# Patient Record
Sex: Female | Born: 1975 | Race: White | Hispanic: No | Marital: Married | Smoking: Never smoker
Health system: Southern US, Community
[De-identification: ages and names within clinical notes are randomized; demographics above are authoritative.]

## PROBLEM LIST (undated history)

## (undated) HISTORY — PX: TUBAL LIGATION: SHX77

## (undated) HISTORY — PX: RECTAL POLYPECTOMY: SHX2309

---

## 2013-04-30 ENCOUNTER — Encounter (HOSPITAL_BASED_OUTPATIENT_CLINIC_OR_DEPARTMENT_OTHER): Payer: Self-pay | Admitting: *Deleted

## 2013-04-30 ENCOUNTER — Emergency Department (HOSPITAL_BASED_OUTPATIENT_CLINIC_OR_DEPARTMENT_OTHER)
Admission: EM | Admit: 2013-04-30 | Discharge: 2013-04-30 | Disposition: A | Discharge status: Home / Self Care | Payer: Self-pay | Attending: Emergency Medicine | Admitting: Emergency Medicine

## 2013-04-30 ENCOUNTER — Emergency Department (HOSPITAL_BASED_OUTPATIENT_CLINIC_OR_DEPARTMENT_OTHER): Payer: Self-pay

## 2013-04-30 DIAGNOSIS — W108XXA Fall (on) (from) other stairs and steps, initial encounter: Secondary | ICD-10-CM | POA: Insufficient documentation

## 2013-04-30 DIAGNOSIS — S93409A Sprain of unspecified ligament of unspecified ankle, initial encounter: Secondary | ICD-10-CM | POA: Insufficient documentation

## 2013-04-30 DIAGNOSIS — Y929 Unspecified place or not applicable: Secondary | ICD-10-CM | POA: Insufficient documentation

## 2013-04-30 DIAGNOSIS — Y939 Activity, unspecified: Secondary | ICD-10-CM | POA: Insufficient documentation

## 2013-04-30 MED ORDER — HYDROCODONE-ACETAMINOPHEN 5-325 MG PO TABS
1.0000 | ORAL_TABLET | Freq: Once | ORAL | Status: AC
  Administered 2013-04-30: 1 via ORAL
  Filled 2013-04-30: qty 1

## 2013-04-30 MED ORDER — HYDROCODONE-ACETAMINOPHEN 5-325 MG PO TABS: ORAL_TABLET | ORAL | Status: AC

## 2013-04-30 MED ORDER — IBUPROFEN 600 MG PO TABS: 600.0000 mg | ORAL_TABLET | Freq: Three times a day (TID) | ORAL | Status: AC

## 2013-04-30 MED ORDER — IBUPROFEN 400 MG PO TABS
400.0000 mg | ORAL_TABLET | Freq: Once | ORAL | Status: AC
  Administered 2013-04-30: 400 mg via ORAL
  Filled 2013-04-30: qty 1

## 2013-04-30 NOTE — Discharge Instructions (Signed)
 Ankle Sprain An ankle sprain is an injury to the strong, fibrous tissues (ligaments) that hold the bones of your ankle joint together.  CAUSES An ankle sprain is usually caused by a fall or by twisting your ankle. Ankle sprains most commonly occur when you step on the outer edge of your foot, and your ankle turns inward. People who participate in sports are more prone to these types of injuries.  SYMPTOMS   Pain in your ankle. The pain may be present at rest or only when you are trying to stand or walk.  Swelling.  Bruising. Bruising may develop immediately or within 1 to 2 days after your injury.  Difficulty standing or walking, particularly when turning corners or changing directions. DIAGNOSIS  Your caregiver will ask you details about your injury and perform a physical exam of your ankle to determine if you have an ankle sprain. During the physical exam, your caregiver will press on and apply pressure to specific areas of your foot and ankle. Your caregiver will try to move your ankle in certain ways. An X-ray exam may be done to be sure a bone was not broken or a ligament did not separate from one of the bones in your ankle (avulsion fracture).  TREATMENT  Certain types of braces can help stabilize your ankle. Your caregiver can make a recommendation for this. Your caregiver may recommend the use of medicine for pain. If your sprain is severe, your caregiver may refer you to a surgeon who helps to restore function to parts of your skeletal system (orthopedist) or a physical therapist. HOME CARE INSTRUCTIONS   Apply ice to your injury for 1 2 days or as directed by your caregiver. Applying ice helps to reduce inflammation and pain.  Put ice in a plastic bag.  Place a towel between your skin and the bag.  Leave the ice on for 15-20 minutes at a time, every 2 hours while you are awake.  Only take over-the-counter or prescription medicines for pain, discomfort, or fever as directed by  your caregiver.  Elevate your injured ankle above the level of your heart as much as possible for 2 3 days.  If your caregiver recommends crutches, use them as instructed. Gradually put weight on the affected ankle. Continue to use crutches or a cane until you can walk without feeling pain in your ankle.  If you have a plaster splint, wear the splint as directed by your caregiver. Do not rest it on anything harder than a pillow for the first 24 hours. Do not put weight on it. Do not get it wet. You may take it off to take a shower or bath.  You may have been given an elastic bandage to wear around your ankle to provide support. If the elastic bandage is too tight (you have numbness or tingling in your foot or your foot becomes cold and blue), adjust the bandage to make it comfortable.  If you have an air splint, you may blow more air into it or let air out to make it more comfortable. You may take your splint off at night and before taking a shower or bath. Wiggle your toes in the splint several times per day to decrease swelling. SEEK MEDICAL CARE IF:   You have rapidly increasing bruising or swelling.  Your toes feel extremely cold or you lose feeling in your foot.  Your pain is not relieved with medicine. SEEK IMMEDIATE MEDICAL CARE IF:  Your toes are numb  or blue.  You have severe pain that is increasing. MAKE SURE YOU:   Understand these instructions.  Will watch your condition.  Will get help right away if you are not doing well or get worse. Document Released: 10/26/2005 Document Revised: 07/20/2012 Document Reviewed: 11/07/2011 [facility] Patient Information 2014 [place], [place].    Narcotic and benzodiazepine use may cause drowsiness, slowed breathing or dependence.  Please use with caution and do not drive, operate machinery or watch young children alone while taking them.  Taking combinations of these medications or drinking alcohol will potentiate these effects.

## 2013-04-30 NOTE — ED Notes (Signed)
I placed an ASO on the patient's left ankle. There isn't any place to charge for an ASO, in the charge capture. This is the only way I know to call attention to not having anyway to charge for it.

## 2013-04-30 NOTE — ED Provider Notes (Signed)
History     CSN: [id number]  Arrival date & time 04/30/13  1412   First MD Initiated Contact with Patient 04/30/13 1451      Chief Complaint  Patient presents with  . Ankle Injury    (Consider location/radiation/quality/duration/timing/severity/associated sxs/prior treatment) HPI Comments: No knee pain, hip pain.  Mechanical fall when she missed last few stairs at bottom of stiarcase in the dark.  No head injury, neck pain.  No laceration or abrasion, no weakness or numbness  Patient is a 37 y.o. female presenting with lower extremity injury. The history is provided by the patient and a relative.  Ankle Injury This is a new problem. The current episode started 12 to 24 hours ago. The problem occurs constantly. The problem has been gradually worsening. The symptoms are aggravated by walking, bending and twisting. The symptoms are relieved by rest. She has tried nothing for the symptoms. The treatment provided no relief.    History reviewed. No pertinent past medical history.  History reviewed. No pertinent past surgical history.  History reviewed. No pertinent family history.  History  Substance Use Topics  . Smoking status: Never Smoker   . Smokeless tobacco: Not on file  . Alcohol Use: No    OB History   Grav Para Term Preterm Abortions TAB SAB Ect Mult Living                  Review of Systems  Cardiovascular: Negative for leg swelling.  Musculoskeletal: Positive for joint swelling and arthralgias.  Skin: Negative for color change, rash and wound.  Neurological: Negative for weakness and numbness.  Hematological: Does not bruise/bleed easily.    Allergies  Prednisone  Home Medications   Current Outpatient Rx  Name  Route  Sig  Dispense  Refill  . HYDROcodone-acetaminophen (NORCO/VICODIN) 5-325 MG per tablet      1-2 tablets po q 6 hours prn moderate to severe pain   20 tablet   0   . ibuprofen (ADVIL,MOTRIN) 600 MG tablet   Oral   Take 1 tablet (600  mg total) by mouth every 8 (eight) hours. Take with food   21 tablet   0     BP   Pulse 80  Temp(Src) 98.6 F (37 C) (Oral)  Resp 16  Ht 5\' 4"  (1.626 m)  Wt 155 lb (70.308 kg)  BMI 26.59 kg/m2  SpO2 99%  LMP 04/01/2013  Physical Exam  Nursing note and vitals reviewed. Constitutional: She appears well-developed and well-nourished. No distress.  Musculoskeletal:       Left ankle: She exhibits decreased range of motion, swelling and ecchymosis. She exhibits no deformity, no laceration and normal pulse. Tenderness. No lateral malleolus, no medial malleolus and no proximal fibula tenderness found. Achilles tendon normal.       Feet:  Skin: Skin is warm and dry. No rash noted.    ED Course  Procedures (including critical care time)  Labs Reviewed - No data to display Dg Ankle Complete Left  04/30/2013   *RADIOLOGY REPORT*  Clinical Data:  Injury to left ankle with pain.  LEFT ANKLE COMPLETE - 3+ VIEW  Comparison:  None.  Findings:  There is no evidence of fracture, dislocation, or joint effusion.  There is no evidence of arthropathy or other focal bone abnormality.  Soft tissues are unremarkable.  IMPRESSION: Negative.   Original Report Authenticated By: Irish Lack, M.D.   Dg Foot Complete Left  04/30/2013   *RADIOLOGY REPORT*  Clinical  Data: Left foot injury with pain.  LEFT FOOT - COMPLETE 3+ VIEW  Comparison:  None.  Findings: Irregularity at the base of the fifth proximal phalanx likely is related to an old fracture.  No acute fracture or dislocation is identified.  Soft tissues are unremarkable.  No evidence of bony lesion or arthropathy.  IMPRESSION: Probable old fracture involving the base of the fifth proximal phalanx.  No acute fracture is identified. .   Original Report Authenticated By: Irish Lack, M.D.     1. Ankle sprain and strain, left, initial encounter       MDM  I reviewed plain films myself.  No neurovascular compromise.  Pt with no acute fracture on  plain films.  No tenderness above malleolus to suggest fracture.  Pt's pain and tenderness are near ligamentous structures connecting fibula to talus.  Likely ankle sprain.  RICE at home, Rx for analgesic.  Pt already with crutches, will provide ASO brace        Gavin Pound. Venkat Ankney, MD 04/30/13 1512

## 2013-04-30 NOTE — ED Notes (Signed)
Pt states she missed the last few steps last p.m. And injured her left foot and ankle. PMS intact

## 2014-01-17 ENCOUNTER — Emergency Department (HOSPITAL_BASED_OUTPATIENT_CLINIC_OR_DEPARTMENT_OTHER)
Admission: EM | Admit: 2014-01-17 | Discharge: 2014-01-17 | Disposition: A | Discharge status: Home / Self Care | Payer: Self-pay | Attending: Emergency Medicine | Admitting: Emergency Medicine

## 2014-01-17 ENCOUNTER — Emergency Department (HOSPITAL_BASED_OUTPATIENT_CLINIC_OR_DEPARTMENT_OTHER): Payer: Self-pay

## 2014-01-17 ENCOUNTER — Encounter (HOSPITAL_BASED_OUTPATIENT_CLINIC_OR_DEPARTMENT_OTHER): Payer: Self-pay | Admitting: Emergency Medicine

## 2014-01-17 DIAGNOSIS — Z9851 Tubal ligation status: Secondary | ICD-10-CM | POA: Insufficient documentation

## 2014-01-17 DIAGNOSIS — R0789 Other chest pain: Secondary | ICD-10-CM | POA: Insufficient documentation

## 2014-01-17 DIAGNOSIS — R079 Chest pain, unspecified: Secondary | ICD-10-CM

## 2014-01-17 DIAGNOSIS — K625 Hemorrhage of anus and rectum: Secondary | ICD-10-CM | POA: Insufficient documentation

## 2014-01-17 DIAGNOSIS — K219 Gastro-esophageal reflux disease without esophagitis: Secondary | ICD-10-CM | POA: Insufficient documentation

## 2014-01-17 DIAGNOSIS — R110 Nausea: Secondary | ICD-10-CM | POA: Insufficient documentation

## 2014-01-17 DIAGNOSIS — R0602 Shortness of breath: Secondary | ICD-10-CM | POA: Insufficient documentation

## 2014-01-17 LAB — CBC WITH DIFFERENTIAL/PLATELET
Basophils Absolute: 0 10*3/uL (ref 0.0–0.1)
Basophils Relative: 0 % (ref 0–1)
EOS ABS: 0.1 10*3/uL (ref 0.0–0.7)
Eosinophils Relative: 1 % (ref 0–5)
HCT: 34.5 % — ABNORMAL LOW (ref 36.0–46.0)
HEMOGLOBIN: 11.4 g/dL — AB (ref 12.0–15.0)
LYMPHS ABS: 1.8 10*3/uL (ref 0.7–4.0)
LYMPHS PCT: 27 % (ref 12–46)
MCH: 30.2 pg (ref 26.0–34.0)
MCHC: 33 g/dL (ref 30.0–36.0)
MCV: 91.5 fL (ref 78.0–100.0)
MONOS PCT: 8 % (ref 3–12)
Monocytes Absolute: 0.5 10*3/uL (ref 0.1–1.0)
NEUTROS PCT: 64 % (ref 43–77)
Neutro Abs: 4.3 10*3/uL (ref 1.7–7.7)
Platelets: 351 10*3/uL (ref 150–400)
RBC: 3.77 MIL/uL — AB (ref 3.87–5.11)
RDW: 12.2 % (ref 11.5–15.5)
WBC: 6.7 10*3/uL (ref 4.0–10.5)

## 2014-01-17 LAB — I-STAT CHEM 8, ED
BUN: 6 mg/dL (ref 6–23)
CHLORIDE: 100 meq/L (ref 96–112)
CREATININE: 0.7 mg/dL (ref 0.50–1.10)
Calcium, Ion: 1.21 mmol/L (ref 1.12–1.23)
Glucose, Bld: 90 mg/dL (ref 70–99)
HCT: 35 % — ABNORMAL LOW (ref 36.0–46.0)
Hemoglobin: 11.9 g/dL — ABNORMAL LOW (ref 12.0–15.0)
Potassium: 3.7 mEq/L (ref 3.7–5.3)
SODIUM: 141 meq/L (ref 137–147)
TCO2: 27 mmol/L (ref 0–100)

## 2014-01-17 LAB — OCCULT BLOOD X 1 CARD TO LAB, STOOL: FECAL OCCULT BLD: POSITIVE — AB

## 2014-01-17 LAB — TROPONIN I: Troponin I: <0.3 ng/mL (ref <0.30)

## 2014-01-17 LAB — D-DIMER, QUANTITATIVE (NOT AT ARMC): D DIMER QUANT: 0.33 ug{FEU}/mL (ref 0.00–0.48)

## 2014-01-17 MED ORDER — PANTOPRAZOLE SODIUM 20 MG PO TBEC: 20.0000 mg | DELAYED_RELEASE_TABLET | Freq: Every day | ORAL | Status: AC

## 2014-01-17 NOTE — Discharge Instructions (Signed)
As discussed you need to follow up with your gi doctor about the bleeding and also discuss reflux. Diet for Gastroesophageal Reflux Disease, Adult Reflux (acid reflux) is when acid from your stomach flows up into the esophagus. When acid comes in contact with the esophagus, the acid causes irritation and soreness (inflammation) in the esophagus. When reflux happens often or so severely that it causes damage to the esophagus, it is called gastroesophageal reflux disease (GERD). Nutrition therapy can help ease the discomfort of GERD. FOODS OR DRINKS TO AVOID OR LIMIT  Smoking or chewing tobacco. Nicotine is one of the most potent stimulants to acid production in the gastrointestinal tract.  Caffeinated and decaffeinated coffee and black tea.  Regular or low-calorie carbonated beverages or energy drinks (caffeine-free carbonated beverages are allowed).   Strong spices, such as black pepper, white pepper, red pepper, cayenne, curry powder, and chili powder.  Peppermint or spearmint.  Chocolate.  High-fat foods, including meats and fried foods. Extra added fats including oils, butter, salad dressings, and nuts. Limit these to less than 8 tsp per day.  Fruits and vegetables if they are not tolerated, such as citrus fruits or tomatoes.  Alcohol.  Any food that seems to aggravate your condition. If you have questions regarding your diet, call your caregiver or a registered dietitian. OTHER THINGS THAT MAY HELP GERD INCLUDE:   Eating your meals slowly, in a relaxed setting.  Eating 5 to 6 small meals per day instead of 3 large meals.  Eliminating food for a period of time if it causes distress.  Not lying down until 3 hours after eating a meal.  Keeping the head of your bed raised 6 to 9 inches (15 to 23 cm) by using a foam wedge or blocks under the legs of the bed. Lying flat may make symptoms worse.  Being physically active. Weight loss may be helpful in reducing reflux in overweight  or obese adults.  Wear loose fitting clothing EXAMPLE MEAL PLAN This meal plan is approximately 2,000 calories based on https://www.[place].org/ChooseMyPlate.gov meal planning guidelines. Breakfast   cup cooked oatmeal.  1 cup strawberries.  1 cup low-fat milk.  1 oz almonds. Snack  1 cup cucumber slices.  6 oz yogurt (made from low-fat or fat-free milk). Lunch  2 slice whole-wheat bread.  2 oz sliced [place].  2 tsp mayonnaise.  1 cup blueberries.  1 cup snap peas. Snack  6 whole-wheat crackers.  1 oz string cheese. Dinner   cup brown rice.  1 cup mixed veggies.  1 tsp olive oil.  3 oz grilled fish. Document Released: 10/26/2005 Document Revised: 01/18/2012 Document Reviewed: 09/11/2011 [facility] Patient Information 2014 [place], [place].

## 2014-01-17 NOTE — ED Provider Notes (Signed)
CSN: [id number]     Arrival date & time 01/17/14  1241 History   First MD Initiated Contact with Patient 01/17/14 1242     Chief Complaint  Patient presents with  . Chest Pain     (Consider location/radiation/quality/duration/timing/severity/associated sxs/prior Treatment) HPI Comments: Pt states that over the last couple of weeks she has had intermittent sharp left sided cp without radiation. Does have some nausea and sob when that pain starts. Pain lasts usually for a couple of minutes and resolves on its own. Nothing makes the pain better or worse. Denies abdominal cp, swelling, fever. She states that she has noticed that when she eats it feels like the food sits at the bottom of her throat, but it is not associated with cp. Pt states that over the last week she has noticed some rectal bleeding, she has had a history of this in the past and has had to have polyps removed  The history is provided by the patient. No language interpreter was used.    History reviewed. No pertinent past medical history. Past Surgical History  Procedure Laterality Date  . Tubal ligation    . Rectal polypectomy     No family history on file. History  Substance Use Topics  . Smoking status: Never Smoker   . Smokeless tobacco: Never Used  . Alcohol Use: 0.5 oz/week    1 drink(s) per week     Comment: maybe once a month   OB History   Grav Para Term Preterm Abortions TAB SAB Ect Mult Living                 Review of Systems  Constitutional: Negative.   Respiratory: Positive for shortness of breath.   Cardiovascular: Positive for chest pain.      Allergies  Prednisone  Home Medications   Current Outpatient Rx  Name  Route  Sig  Dispense  Refill  . HYDROcodone-acetaminophen (NORCO/VICODIN) 5-325 MG per tablet      1-2 tablets po q 6 hours prn moderate to severe pain   20 tablet   0   . ibuprofen (ADVIL,MOTRIN) 600 MG tablet   Oral   Take 1 tablet (600 mg total) by mouth every 8  (eight) hours. Take with food   21 tablet   0    BP 102/76  Pulse 78  Temp(Src) 97.6 F (36.4 C) (Oral)  Resp 18  SpO2 99%  LMP 01/07/2014 Physical Exam  Nursing note and vitals reviewed. Constitutional: She is oriented to person, place, and time. She appears well-developed and well-nourished.  Cardiovascular: Normal rate and regular rhythm.   Pulmonary/Chest: Effort normal and breath sounds normal.  Abdominal: Soft. Bowel sounds are normal. There is no tenderness.  Genitourinary:  No hemorrhoids or gross rectal bleeding noted  Musculoskeletal: Normal range of motion.  Neurological: She is alert and oriented to person, place, and time.  Skin: Skin is dry.  Psychiatric: She has a normal mood and affect.    ED Course  Procedures (including critical care time) Labs Review Labs Reviewed  CBC WITH DIFFERENTIAL - Abnormal; Notable for the following:    RBC 3.77 (*)    Hemoglobin 11.4 (*)    HCT 34.5 (*)    All other components within normal limits  OCCULT BLOOD X 1 CARD TO LAB, STOOL - Abnormal; Notable for the following:    Fecal Occult Bld POSITIVE (*)    All other components within normal limits  I-STAT CHEM 8, ED -  Abnormal; Notable for the following:    Hemoglobin 11.9 (*)    HCT 35.0 (*)    All other components within normal limits  TROPONIN I  D-DIMER, QUANTITATIVE  POC OCCULT BLOOD, ED   Imaging Review Dg Chest 2 View  01/17/2014   CLINICAL DATA Chest pain  EXAM CHEST  2 VIEW  COMPARISON None.  FINDINGS Lungs are clear. The heart size and pulmonary vascularity are normal. No adenopathy. No pneumothorax. No bone lesions.  IMPRESSION No abnormality noted.  SIGNATURE  Electronically Signed   By: Bretta BangWilliam  Woodruff M.D.   On: 01/17/2014 13:47     EKG Interpretation   Date/Time:  Wednesday January 17 2014 12:51:51 EDT Ventricular Rate:  59 PR Interval:  142 QRS Duration: 94 QT Interval:  388 QTC Calculation: 384 R Axis:   81 Text Interpretation:  Sinus  bradycardia Otherwise normal ECG Confirmed by  WARD,  DO, KRISTEN ([id number]) on 01/17/2014 1:04:05 PM      MDM   Final diagnoses:  Chest pain  GERD (gastroesophageal reflux disease)  Rectal bleeding    Pt is pain free at this time. More likely gerd doubt acs or pe. Discussed with pt follow up with gi for recheck with colonoscopy. Pt not grossly positive and hgb not concerning    Teressa LowerVrinda Shinichi Anguiano, NP 01/17/14 [phone]

## 2014-01-17 NOTE — ED Provider Notes (Signed)
Medical screening examination/treatment/procedure(s) were performed by non-physician practitioner and as supervising physician I was immediately available for consultation/collaboration.   EKG Interpretation   Date/Time:  Wednesday January 17 2014 12:51:51 EDT Ventricular Rate:  59 PR Interval:  142 QRS Duration: 94 QT Interval:  388 QTC Calculation: 384 R Axis:   81 Text Interpretation:  Sinus bradycardia Otherwise normal ECG Confirmed by  Jamee Keach,  DO, Deairra Halleck ([id number]) on 01/17/2014 1:04:05 PM        Layla MawKristen N Merin Borjon, DO 01/17/14 1526

## 2014-01-17 NOTE — ED Notes (Signed)
Pt reports recurrent left central chest pain x 2 weeks- States after she eats or drinks she feels like "it's sitting at the bottom of my throat"

## 2015-06-30 IMAGING — CR DG CHEST 2V
2 series · 2 of 2 positions shown · non-contrast
Comparison: none

[w chest pa]
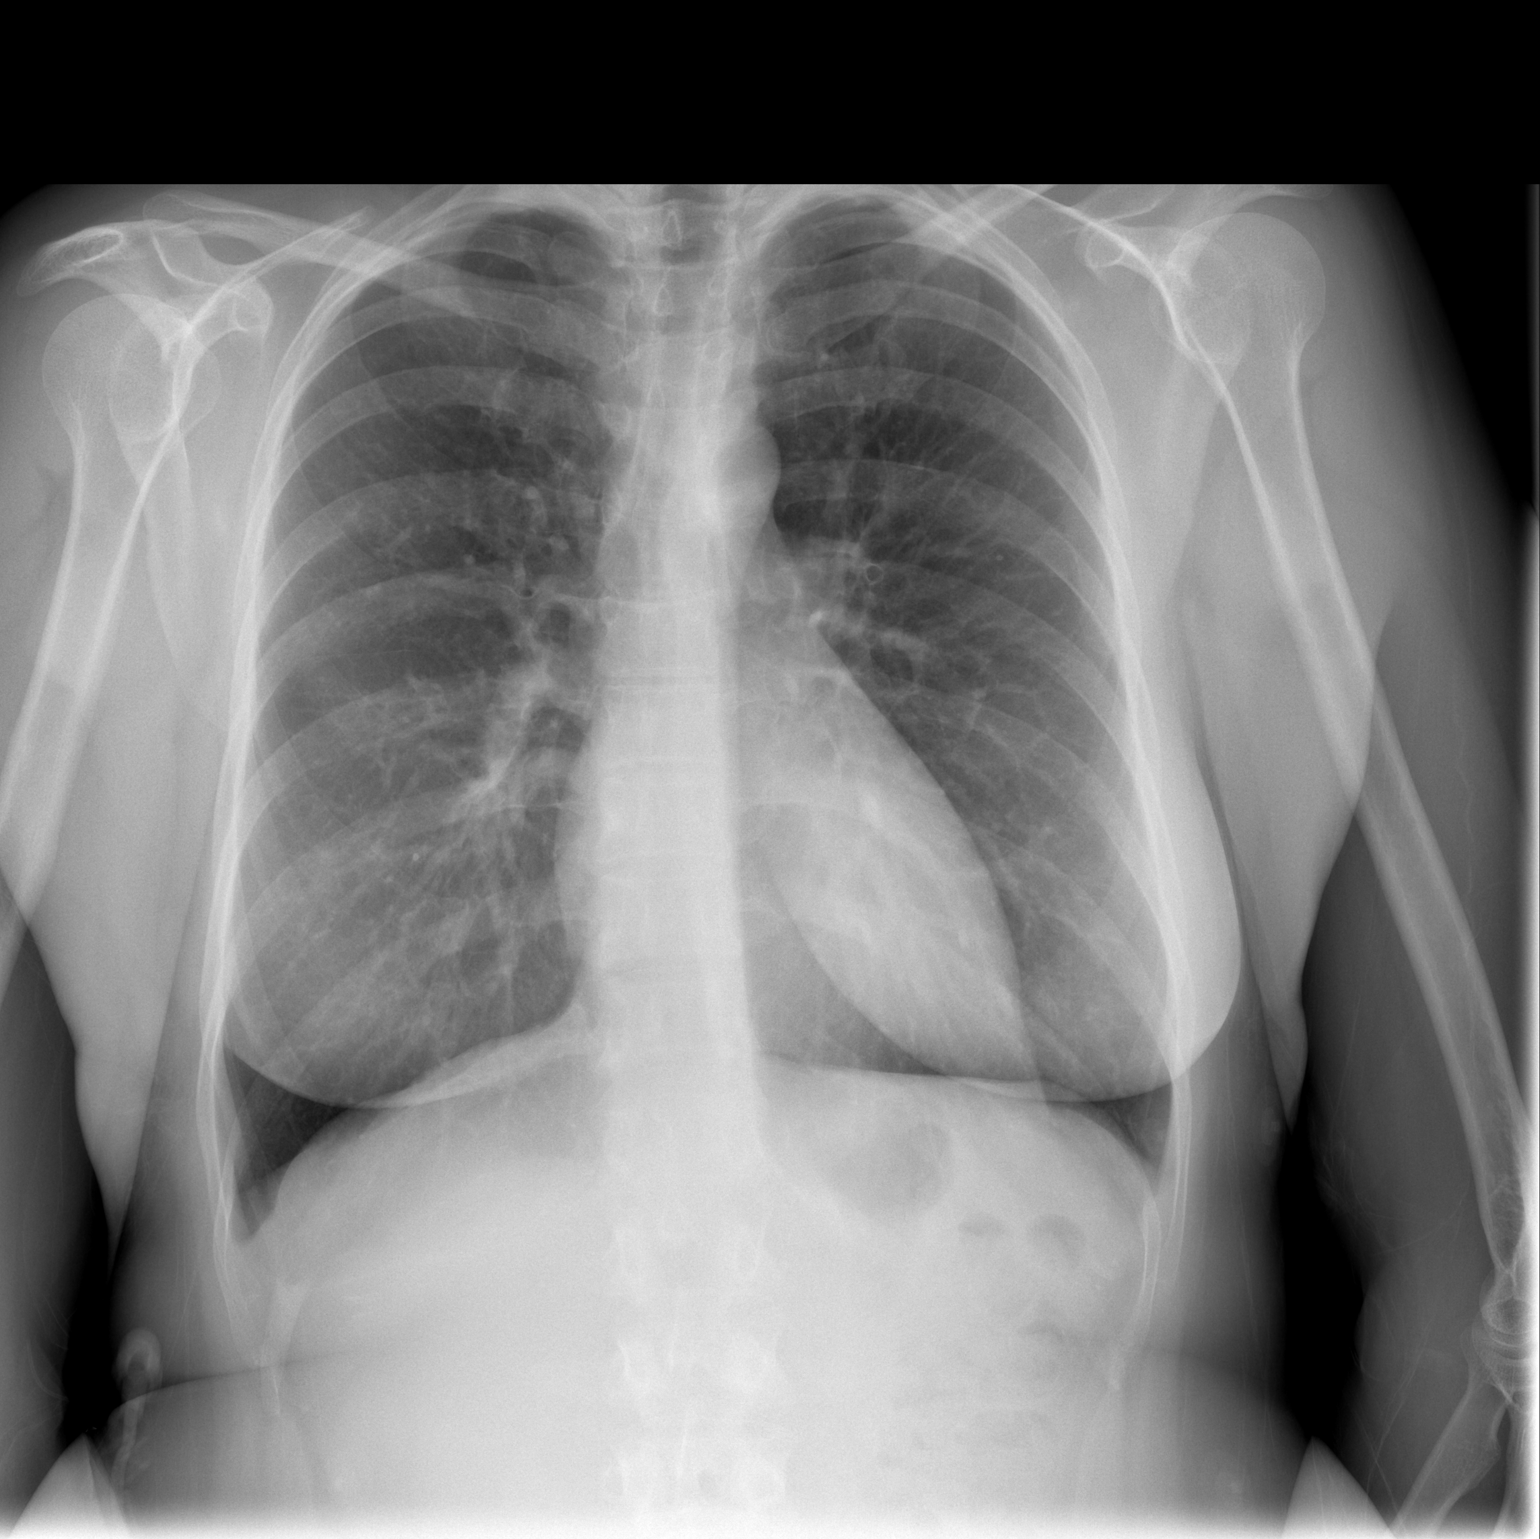

[w chest lat]
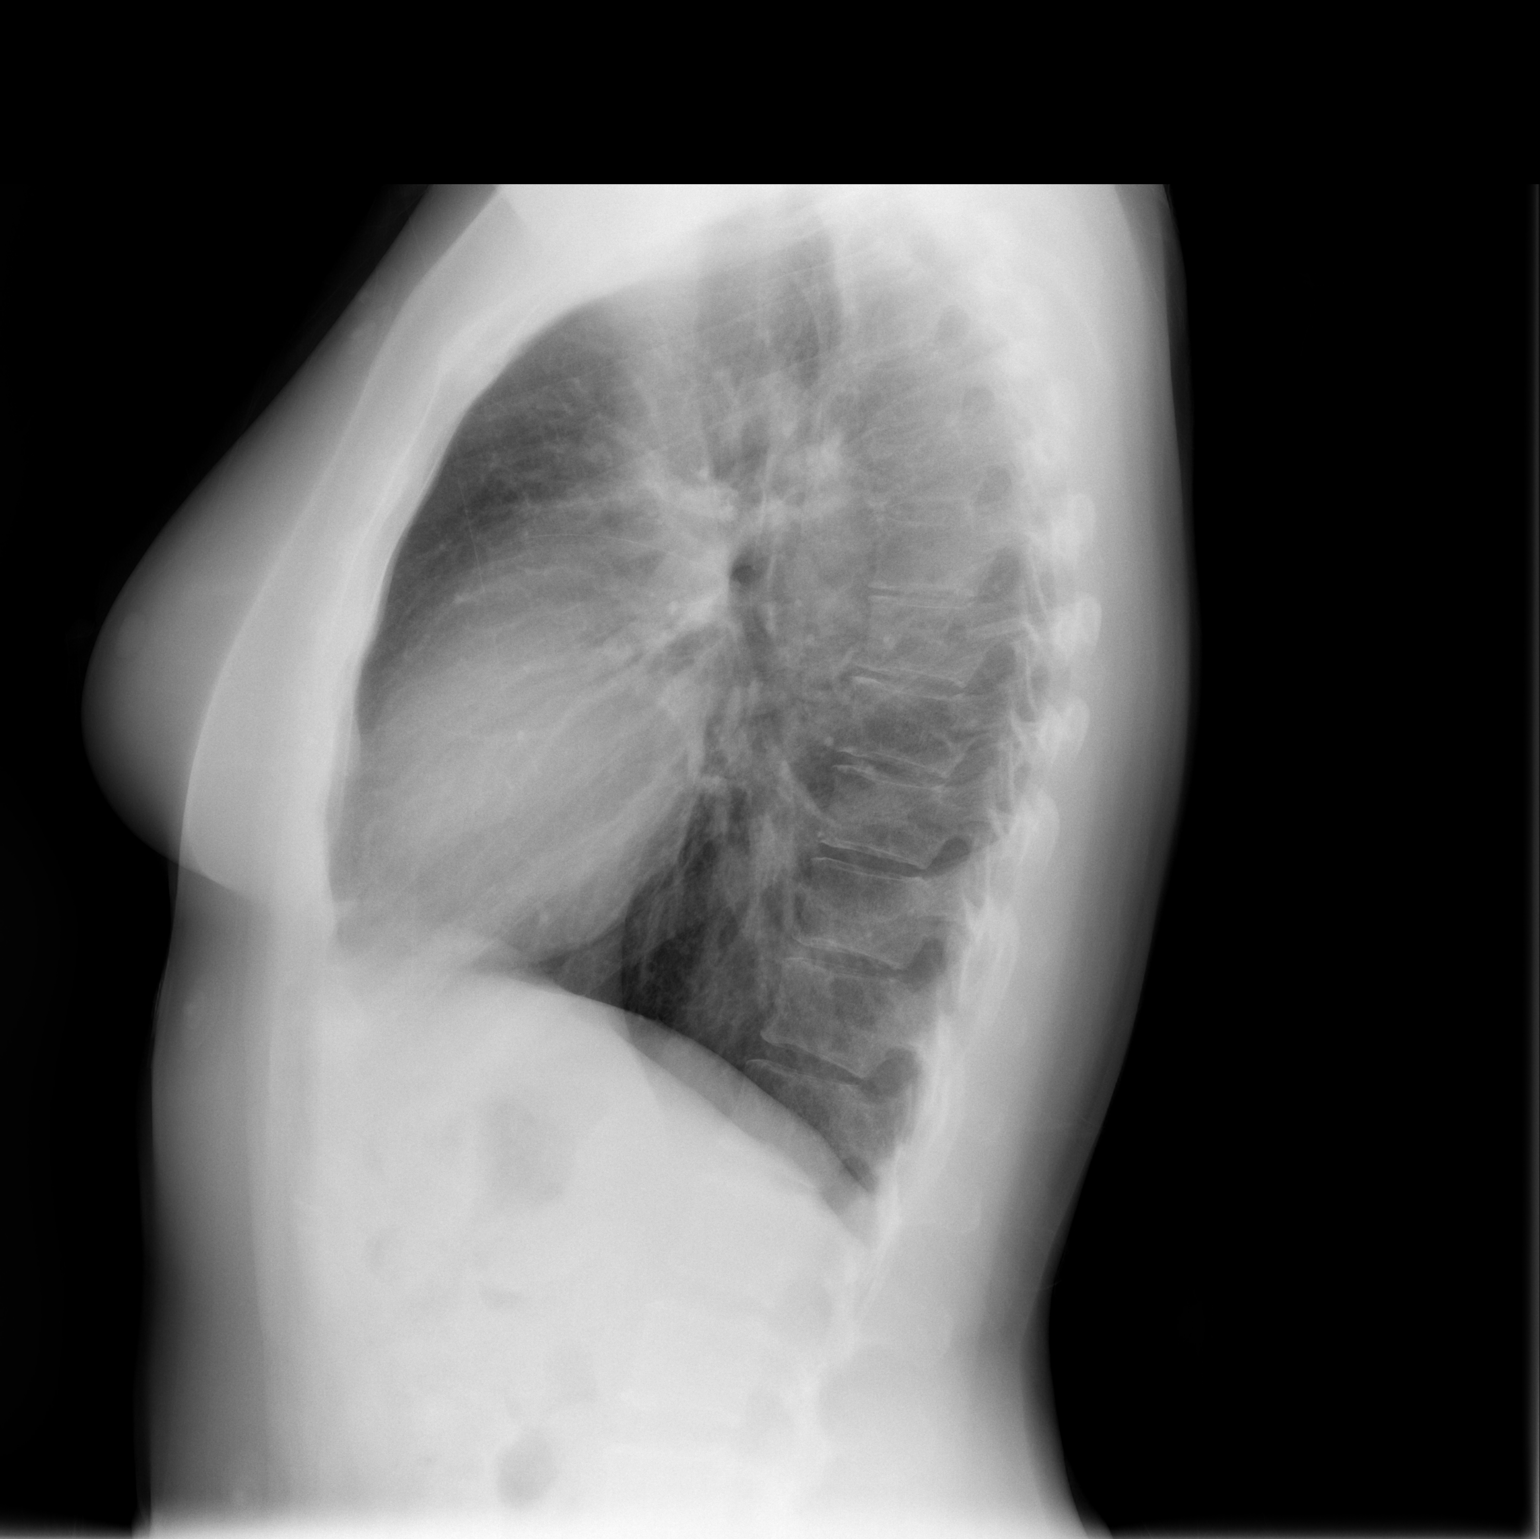

[2 of 2 positions shown; findings below may reference images not displayed]

CLINICAL DATA
Chest pain

EXAM
CHEST  2 VIEW

COMPARISON
None.

FINDINGS
Lungs are clear. The heart size and pulmonary vascularity are
normal. No adenopathy. No pneumothorax. No bone lesions.

IMPRESSION
No abnormality noted.

SIGNATURE
